# Patient Record
Sex: Female | Born: 1955 | Race: White | Hispanic: No | Smoking: Never smoker
Health system: Southern US, Community
[De-identification: ages and names within clinical notes are randomized; demographics above are authoritative.]

## PROBLEM LIST (undated history)

## (undated) DIAGNOSIS — D649 Anemia, unspecified: Secondary | ICD-10-CM

## (undated) DIAGNOSIS — R87619 Unspecified abnormal cytological findings in specimens from cervix uteri: Secondary | ICD-10-CM

## (undated) DIAGNOSIS — T7840XA Allergy, unspecified, initial encounter: Secondary | ICD-10-CM

## (undated) DIAGNOSIS — D249 Benign neoplasm of unspecified breast: Secondary | ICD-10-CM

## (undated) HISTORY — DX: Anemia, unspecified: D64.9

## (undated) HISTORY — DX: Benign neoplasm of unspecified breast: D24.9

## (undated) HISTORY — DX: Unspecified abnormal cytological findings in specimens from cervix uteri: R87.619

## (undated) HISTORY — DX: Allergy, unspecified, initial encounter: T78.40XA

## (undated) HISTORY — PX: VARICOSE VEIN SURGERY: SHX832

---

## 1997-12-12 ENCOUNTER — Other Ambulatory Visit: Admission: RE | Admit: 1997-12-12 | Discharge: 1997-12-12 | Payer: Self-pay | Admitting: Obstetrics and Gynecology

## 1997-12-20 ENCOUNTER — Ambulatory Visit (HOSPITAL_COMMUNITY): Admission: RE | Admit: 1997-12-20 | Discharge: 1997-12-20 | Payer: Self-pay | Admitting: Obstetrics and Gynecology

## 1998-01-19 ENCOUNTER — Ambulatory Visit (HOSPITAL_COMMUNITY): Admission: RE | Admit: 1998-01-19 | Discharge: 1998-01-19 | Payer: Self-pay | Admitting: Obstetrics and Gynecology

## 1999-04-05 ENCOUNTER — Encounter: Payer: Self-pay | Admitting: Obstetrics and Gynecology

## 1999-04-05 ENCOUNTER — Ambulatory Visit (HOSPITAL_COMMUNITY): Admission: RE | Admit: 1999-04-05 | Discharge: 1999-04-05 | Payer: Self-pay | Admitting: Obstetrics and Gynecology

## 1999-05-08 ENCOUNTER — Other Ambulatory Visit: Admission: RE | Admit: 1999-05-08 | Discharge: 1999-05-08 | Payer: Self-pay | Admitting: Obstetrics and Gynecology

## 2000-11-12 ENCOUNTER — Encounter: Payer: Self-pay | Admitting: Emergency Medicine

## 2000-11-12 ENCOUNTER — Emergency Department (HOSPITAL_COMMUNITY): Admission: EM | Admit: 2000-11-12 | Discharge: 2000-11-12 | Payer: Self-pay | Admitting: Emergency Medicine

## 2002-12-23 ENCOUNTER — Encounter: Payer: Self-pay | Admitting: Obstetrics and Gynecology

## 2002-12-23 ENCOUNTER — Ambulatory Visit (HOSPITAL_COMMUNITY): Admission: RE | Admit: 2002-12-23 | Discharge: 2002-12-23 | Payer: Self-pay | Admitting: Obstetrics and Gynecology

## 2003-03-15 ENCOUNTER — Encounter: Payer: Self-pay | Admitting: Obstetrics and Gynecology

## 2003-03-15 ENCOUNTER — Encounter: Admission: RE | Admit: 2003-03-15 | Discharge: 2003-03-15 | Payer: Self-pay | Admitting: Obstetrics and Gynecology

## 2003-04-05 ENCOUNTER — Other Ambulatory Visit: Admission: RE | Admit: 2003-04-05 | Discharge: 2003-04-05 | Payer: Self-pay | Admitting: Obstetrics and Gynecology

## 2004-10-29 ENCOUNTER — Encounter: Admission: RE | Admit: 2004-10-29 | Discharge: 2005-01-17 | Payer: Self-pay | Admitting: Family Medicine

## 2005-02-10 ENCOUNTER — Encounter: Admission: RE | Admit: 2005-02-10 | Discharge: 2005-02-10 | Payer: Self-pay | Admitting: Obstetrics and Gynecology

## 2005-04-09 ENCOUNTER — Other Ambulatory Visit: Admission: RE | Admit: 2005-04-09 | Discharge: 2005-04-09 | Payer: Self-pay | Admitting: Obstetrics and Gynecology

## 2005-05-19 ENCOUNTER — Encounter: Admission: RE | Admit: 2005-05-19 | Discharge: 2005-06-27 | Payer: Self-pay | Admitting: Neurosurgery

## 2005-09-03 ENCOUNTER — Other Ambulatory Visit: Admission: RE | Admit: 2005-09-03 | Discharge: 2005-09-03 | Payer: Self-pay | Admitting: Obstetrics and Gynecology

## 2006-01-20 ENCOUNTER — Other Ambulatory Visit: Admission: RE | Admit: 2006-01-20 | Discharge: 2006-01-20 | Payer: Self-pay | Admitting: Obstetrics and Gynecology

## 2006-11-12 ENCOUNTER — Encounter: Admission: RE | Admit: 2006-11-12 | Discharge: 2006-11-12 | Payer: Self-pay | Admitting: Obstetrics and Gynecology

## 2009-11-09 ENCOUNTER — Encounter: Admission: RE | Admit: 2009-11-09 | Discharge: 2009-11-09 | Payer: Self-pay | Admitting: Obstetrics and Gynecology

## 2010-11-28 ENCOUNTER — Other Ambulatory Visit: Payer: Self-pay | Admitting: Obstetrics and Gynecology

## 2010-11-28 DIAGNOSIS — Z1231 Encounter for screening mammogram for malignant neoplasm of breast: Secondary | ICD-10-CM

## 2010-12-06 ENCOUNTER — Ambulatory Visit
Admission: RE | Admit: 2010-12-06 | Discharge: 2010-12-06 | Disposition: A | Discharge status: Home / Self Care | Payer: 59 | Source: Ambulatory Visit | Attending: Obstetrics and Gynecology | Admitting: Obstetrics and Gynecology

## 2010-12-06 DIAGNOSIS — Z1231 Encounter for screening mammogram for malignant neoplasm of breast: Secondary | ICD-10-CM

## 2011-08-18 ENCOUNTER — Ambulatory Visit: Payer: 59 | Admitting: Physician Assistant

## 2011-08-18 VITALS — BP 110/76 | HR 76 | Temp 98.5°F | Resp 20 | Ht 67.0 in | Wt 122.2 lb

## 2011-08-18 DIAGNOSIS — J019 Acute sinusitis, unspecified: Secondary | ICD-10-CM

## 2011-08-18 MED ORDER — CEFDINIR 300 MG PO CAPS: 600.0000 mg | ORAL_CAPSULE | Freq: Every day | ORAL | Status: AC

## 2011-08-18 MED ORDER — GUAIFENESIN ER 1200 MG PO TB12: 1.0000 | ORAL_TABLET | Freq: Two times a day (BID) | ORAL | Status: DC | PRN

## 2011-08-18 MED ORDER — HYDROCOD POLST-CHLORPHEN POLST 10-8 MG/5ML PO LQCR: 5.0000 mL | Freq: Two times a day (BID) | ORAL | Status: DC | PRN

## 2011-08-18 MED ORDER — IPRATROPIUM BROMIDE 0.03 % NA SOLN: 2.0000 | Freq: Two times a day (BID) | NASAL | Status: DC

## 2011-08-18 NOTE — Patient Instructions (Signed)
Rest.  Drink at least 64 ounces of fluids daily.  Return if your symptoms are not improving in 48 hours, sooner if you worsen.

## 2011-08-18 NOTE — Progress Notes (Signed)
  Subjective:    Patient ID: Shannon Lambert, female    DOB: 1956-01-31, 56 y.o.   MRN: [id number]  HPI Patient presents with 9 days of sinus pressure and pain, thick mucous drainage and cough. She has a sore throat and ear fullness. Cough and sinus pain or keeping her awake. Mild nausea, associated with thick postnasal drainage. No diarrhea. Some myalgias in the neck and back but not elsewhere. No urinary symptoms. No rash. Mucinex D seems to make her symptoms worse.   Review of Systems     Objective:   Physical Exam  Vitals reviewed. Constitutional: She is oriented to person, place, and time. Vital signs are normal. She appears well-developed and well-nourished. No distress.  HENT:  Head: Normocephalic and atraumatic.  Right Ear: Hearing, tympanic membrane, external ear and ear canal normal.  Left Ear: Hearing, tympanic membrane, external ear and ear canal normal.  Nose: Mucosal edema and rhinorrhea present.  No foreign bodies. Right sinus exhibits maxillary sinus tenderness and frontal sinus tenderness. Left sinus exhibits maxillary sinus tenderness and frontal sinus tenderness.  Mouth/Throat: Uvula is midline, oropharynx is clear and moist and mucous membranes are normal. No uvula swelling. No oropharyngeal exudate.  Eyes: Conjunctivae and EOM are normal. Pupils are equal, round, and reactive to light. Right eye exhibits no discharge. Left eye exhibits no discharge. No scleral icterus.  Neck: Trachea normal, normal range of motion and full passive range of motion without pain. Neck supple. No mass and no thyromegaly present.  Cardiovascular: Normal rate, regular rhythm and normal heart sounds.   Pulmonary/Chest: Effort normal and breath sounds normal.  Lymphadenopathy:       Head (right side): No submandibular, no tonsillar, no preauricular, no posterior auricular and no occipital adenopathy present.       Head (left side): No submandibular, no tonsillar, no preauricular and no occipital  adenopathy present.    She has no cervical adenopathy.       Right: No supraclavicular adenopathy present.       Left: No supraclavicular adenopathy present.  Neurological: She is alert and oriented to person, place, and time. She has normal strength. No cranial nerve deficit or sensory deficit.  Skin: Skin is warm, dry and intact. No rash noted.  Psychiatric: She has a normal mood and affect. Her speech is normal and behavior is normal.      Assessment & Plan:  Sinusitis. Discontinue Mucinex D. Replace with Mucinex at maximum strength. Omnicef 300 mg 2 capsules daily x10 days. Atrovent nasal spray, 2 sprays in each nostril twice daily, Tussionex 5 mL by mouth every 12 hours when necessary. She has side effects of bad dreams and hallucinations with codeine, has never tried hydrocodone but is interested in trying this. We discussed the potential of similar side effects but that she also may tolerate it without any adverse effects. If she experiences adverse effects she will call, we could replace the Tussionex with Tessalon Perles.  Rest, fluids, ibuprofen or acetaminophen. Out of work tomorrow. Return for reevaluation if symptoms worsen or persist without improvement beyond the next 48 hours.

## 2012-01-11 ENCOUNTER — Ambulatory Visit: Payer: 59 | Admitting: Family Medicine

## 2012-01-11 VITALS — BP 118/65 | HR 52 | Temp 98.3°F | Resp 17 | Ht 67.5 in | Wt 126.0 lb

## 2012-01-11 DIAGNOSIS — M542 Cervicalgia: Secondary | ICD-10-CM

## 2012-01-11 DIAGNOSIS — M25519 Pain in unspecified shoulder: Secondary | ICD-10-CM

## 2012-01-11 DIAGNOSIS — M25511 Pain in right shoulder: Secondary | ICD-10-CM

## 2012-01-11 MED ORDER — CYCLOBENZAPRINE HCL 5 MG PO TABS: 5.0000 mg | ORAL_TABLET | Freq: Two times a day (BID) | ORAL | Status: AC | PRN

## 2012-01-11 MED ORDER — PREDNISONE 20 MG PO TABS: ORAL_TABLET | ORAL | Status: DC

## 2012-01-11 NOTE — Progress Notes (Signed)
56 yo woman with right medial scapular and shoulder pain after looking to the right for several hours one week ago. She feels some numbness ulnar three fingers.  Also has chronic right thenar pain.  Ibuprofen not helping  Objective:  NAD  Tender medial superior corner of right scapula Full shoulder ROM nontender shoulder  Skin: no ecchymosis or rash  Assessment:  C7 neuropathy  Plan:   1. Shoulder pain, right  predniSONE (DELTASONE) 20 MG tablet, cyclobenzaprine (FLEXERIL) 5 MG tablet  2. Neck pain on right side  predniSONE (DELTASONE) 20 MG tablet, cyclobenzaprine (FLEXERIL) 5 MG tablet

## 2012-01-23 ENCOUNTER — Other Ambulatory Visit: Payer: Self-pay | Admitting: Obstetrics and Gynecology

## 2012-01-23 DIAGNOSIS — Z1231 Encounter for screening mammogram for malignant neoplasm of breast: Secondary | ICD-10-CM

## 2012-02-02 ENCOUNTER — Ambulatory Visit: Payer: 59

## 2012-02-05 ENCOUNTER — Ambulatory Visit: Payer: 59

## 2012-04-19 ENCOUNTER — Ambulatory Visit
Admission: RE | Admit: 2012-04-19 | Discharge: 2012-04-19 | Disposition: A | Discharge status: Home / Self Care | Payer: 59 | Source: Ambulatory Visit | Attending: Obstetrics and Gynecology | Admitting: Obstetrics and Gynecology

## 2012-04-19 DIAGNOSIS — Z1231 Encounter for screening mammogram for malignant neoplasm of breast: Secondary | ICD-10-CM

## 2013-04-05 ENCOUNTER — Ambulatory Visit: Payer: 59 | Admitting: Family Medicine

## 2013-04-05 VITALS — BP 120/78 | HR 72 | Temp 99.5°F | Resp 18 | Ht 67.5 in | Wt 125.0 lb

## 2013-04-05 DIAGNOSIS — R51 Headache: Secondary | ICD-10-CM

## 2013-04-05 DIAGNOSIS — J209 Acute bronchitis, unspecified: Secondary | ICD-10-CM

## 2013-04-05 DIAGNOSIS — R519 Headache, unspecified: Secondary | ICD-10-CM

## 2013-04-05 MED ORDER — AZITHROMYCIN 250 MG PO TABS: ORAL_TABLET | ORAL | Status: DC

## 2013-04-05 MED ORDER — HYDROCODONE-HOMATROPINE 5-1.5 MG/5ML PO SYRP: 5.0000 mL | ORAL_SOLUTION | Freq: Three times a day (TID) | ORAL | Status: DC | PRN

## 2013-04-05 NOTE — Progress Notes (Signed)
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  No cough or shortness of breath. Denies any sinus congestion. Burning with cough.  Objective:  NAD Cough

## 2013-06-15 ENCOUNTER — Ambulatory Visit: Payer: 59 | Admitting: Family Medicine

## 2013-06-15 VITALS — BP 118/74 | HR 51 | Temp 98.3°F | Resp 16 | Ht 67.5 in | Wt 127.8 lb

## 2013-06-15 DIAGNOSIS — M25511 Pain in right shoulder: Secondary | ICD-10-CM

## 2013-06-15 DIAGNOSIS — J209 Acute bronchitis, unspecified: Secondary | ICD-10-CM

## 2013-06-15 DIAGNOSIS — M25519 Pain in unspecified shoulder: Secondary | ICD-10-CM

## 2013-06-15 DIAGNOSIS — M542 Cervicalgia: Secondary | ICD-10-CM

## 2013-06-15 DIAGNOSIS — R51 Headache: Secondary | ICD-10-CM

## 2013-06-15 DIAGNOSIS — R519 Headache, unspecified: Secondary | ICD-10-CM

## 2013-06-15 MED ORDER — PREDNISONE 20 MG PO TABS: ORAL_TABLET | ORAL | Status: DC

## 2013-06-15 MED ORDER — AZITHROMYCIN 250 MG PO TABS: ORAL_TABLET | ORAL | Status: DC

## 2013-06-15 NOTE — Progress Notes (Signed)
° °  Subjective:  This chart was scribed for Elvina Sidle, MD by Carl Best, Medical Scribe. This patient was seen in Room 5 and the patient's care was started at 6:42 PM.  Patient ID: Shannon Lambert, female    DOB: [birth year], 57 y.o.   MRN: [id number]  HPI HPI Comments: Shannon Lambert is a 57 y.o. female who presents to the [facility] complaining of otalgia in her left ear which causes a roaring sound, right sided neck stiffness and pain, and facial pain that started a week ago.  She states that her symptoms will start with her neck pain and then progress to the otalgia which causes her to hear a "roaring" sound in her left ear.  She states that loud noises aggravate her otalgia.  She states that she has taken Execedrine, Sudafed, and Ibuprofen for her symptoms with no relief.  She states that when she was prescribed a Z-Pak last time she came to [facility] and experienced no problems when taking it.  She states that Penicillin causes abdominal pain. 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     Past Medical History  Diagnosis Date   Allergy    Past Surgical History  Procedure Laterality Date   Varicose vein surgery Bilateral     laser surgery June 2013 and 05/20/12,    No family history on file. History   Social History   Marital Status: Divorced    Spouse Name: N/A    Number of Children: N/A   Years of Education: N/A   Occupational History   Not on file.   Social History Main Topics   Smoking status: Never Smoker    Smokeless tobacco: Not on file   Alcohol Use: No   Drug Use: No   Sexual Activity: Not on file   Other Topics Concern   Not on file   Social History Narrative   No narrative on file   Allergies  Allergen Reactions   Codeine     Bad dreams/hallucinations   Penicillins     GI upset     Review of Systems  HENT: Positive for ear pain (left ear).   Musculoskeletal: Positive  for neck pain and neck stiffness.  All other systems reviewed and are negative.      Objective:  Physical Exam No acute distress Neck: Patient is able to turn it both ways and put her chin on her chest without to much difficulty. She points to the posterior right neck as a source of pain. Eardrums: Mild retraction on the left otherwise both are normal Oropharynx: Clear Neck exam reveals no specific adenopathy Skin: No rash or localized lesion   Assessment & Plan:   I personally performed the services described in this documentation, which was scribed in my presence. The recorded information has been reviewed and is accurate.  Acute bronchitis - Plan: azithromycin (ZITHROMAX Z-PAK) 250 MG tablet  Headache - Plan: azithromycin (ZITHROMAX Z-PAK) 250 MG tablet  Neck pain on right side - Plan: predniSONE (DELTASONE) 20 MG tablet  Shoulder pain, right - Plan: predniSONE (DELTASONE) 20 MG tablet  Signed, Elvina Sidle, MD

## 2013-06-16 ENCOUNTER — Telehealth: Payer: Self-pay

## 2013-06-16 DIAGNOSIS — R110 Nausea: Secondary | ICD-10-CM

## 2013-06-16 MED ORDER — ONDANSETRON 4 MG PO TBDP: 4.0000 mg | ORAL_TABLET | Freq: Three times a day (TID) | ORAL | Status: DC | PRN

## 2013-06-16 NOTE — Telephone Encounter (Signed)
Spoke to her to advise the Zpack is for bronchitis, the prednisone is for her neck. The Zpack is making her nauseated, please advise. She is taking with food and getting lots of fluids, this has not been helpful

## 2013-06-16 NOTE — Telephone Encounter (Signed)
Thanks. Left message for daughter to advise.

## 2013-06-16 NOTE — Telephone Encounter (Signed)
Patient's daughter states that patient is not responding well to the Z-Pak for her neck pain. She has nausea and a headache.[facility]  [phone]

## 2013-06-16 NOTE — Telephone Encounter (Signed)
Nausea is a common side effect of all antibiotics. I can send in some Zofran.

## 2013-06-21 ENCOUNTER — Other Ambulatory Visit: Payer: Self-pay | Admitting: Family Medicine

## 2013-06-22 ENCOUNTER — Telehealth: Payer: Self-pay

## 2013-06-22 NOTE — Telephone Encounter (Signed)
Spoke to her advised Mucinex/ flonase for ear. Advised Zpack still working even though she has completed. She will give these a day or so and return if not improved or if she worsens.

## 2013-06-22 NOTE — Telephone Encounter (Signed)
I do not see message from yesterday there was a medication denial sent. Patient should return to clinic. If she would like to try mucinex and flonase, this may be helpful. Left message for her to call me back so I can advise.

## 2013-06-22 NOTE — Telephone Encounter (Signed)
Patient states that she is having pressure in her ears and head. States she left a message yesterday and has not heard anything.  [phone]

## 2013-06-23 ENCOUNTER — Telehealth: Payer: Self-pay | Admitting: Radiology

## 2013-06-23 MED ORDER — IPRATROPIUM BROMIDE 0.03 % NA SOLN: 2.0000 | Freq: Two times a day (BID) | NASAL | Status: DC

## 2013-06-23 MED ORDER — GUAIFENESIN ER 1200 MG PO TB12: 1.0000 | ORAL_TABLET | Freq: Two times a day (BID) | ORAL | Status: DC | PRN

## 2013-06-23 NOTE — Telephone Encounter (Signed)
Please call patient. She is upset she needs to speak to someone about her recent visit. She has concerns about the visit, indicates she waited 2 and 1 /2 hours and was seen very quickly, and she does not feel as though she had a good exam, states her lungs/heart were not listened to and she is very upset she had to come in again and pay for another visit. Call her please, [phone]

## 2013-06-23 NOTE — Telephone Encounter (Signed)
Rx for atrovent sent to the pharmacy. Take this in addition to plain zyrtec. If dizziness continues she will need to be rechecked. Patient informed by French Ana

## 2013-06-23 NOTE — Telephone Encounter (Signed)
Spoke to patient she is complaining of increased dizziness and ear pain. She wants another antibiotic. Advised her to return to clinic.

## 2013-06-23 NOTE — Telephone Encounter (Signed)
Symptoms resolved after using the z-pak and prednisone. She has been using Mucinex as recommended from Amy. However, in the last week symptoms have returned. Dizziness from pressure in hears, headache. Can we send in RF on z-pak? I advised her that if this does not work, she would have to RTC to be seen. Please advise.  [facility]

## 2013-07-05 ENCOUNTER — Telehealth: Payer: Self-pay

## 2013-07-05 DIAGNOSIS — H9209 Otalgia, unspecified ear: Secondary | ICD-10-CM

## 2013-07-05 NOTE — Telephone Encounter (Signed)
Okay for referral? Please advise   

## 2013-07-05 NOTE — Telephone Encounter (Signed)
PT STATES HER EARS ISN'T GETTING ANY BETTER AND WOULD LIKE TO BE REFERRED TO AN ENT PLEASE CALL [phone] AND LEAVE MESSAGE

## 2013-10-18 ENCOUNTER — Other Ambulatory Visit: Payer: Self-pay

## 2013-10-18 DIAGNOSIS — Z1231 Encounter for screening mammogram for malignant neoplasm of breast: Secondary | ICD-10-CM

## 2013-10-27 ENCOUNTER — Encounter (INDEPENDENT_AMBULATORY_CARE_PROVIDER_SITE_OTHER): Payer: Self-pay

## 2013-10-27 ENCOUNTER — Ambulatory Visit
Admission: RE | Admit: 2013-10-27 | Discharge: 2013-10-27 | Disposition: A | Discharge status: Home / Self Care | Payer: Self-pay | Source: Ambulatory Visit

## 2013-10-27 DIAGNOSIS — Z1231 Encounter for screening mammogram for malignant neoplasm of breast: Secondary | ICD-10-CM

## 2014-04-13 ENCOUNTER — Ambulatory Visit: Payer: 59 | Admitting: Certified Nurse Midwife

## 2014-05-10 ENCOUNTER — Encounter: Payer: Self-pay | Admitting: Certified Nurse Midwife

## 2014-05-11 ENCOUNTER — Telehealth: Payer: Self-pay | Admitting: Certified Nurse Midwife

## 2014-05-11 ENCOUNTER — Ambulatory Visit: Payer: 59 | Admitting: Certified Nurse Midwife

## 2014-05-11 NOTE — Telephone Encounter (Signed)
Pt rescheduled appointment today because she is sick. Will not charge a dnka fee.

## 2014-05-18 ENCOUNTER — Ambulatory Visit (INDEPENDENT_AMBULATORY_CARE_PROVIDER_SITE_OTHER): Payer: 59 | Admitting: Certified Nurse Midwife

## 2014-05-18 ENCOUNTER — Encounter: Payer: Self-pay | Admitting: Certified Nurse Midwife

## 2014-05-18 VITALS — BP 110/68 | HR 68 | Resp 16 | Ht 67.25 in | Wt 126.0 lb

## 2014-05-18 DIAGNOSIS — Z1211 Encounter for screening for malignant neoplasm of colon: Secondary | ICD-10-CM

## 2014-05-18 DIAGNOSIS — Z23 Encounter for immunization: Secondary | ICD-10-CM

## 2014-05-18 DIAGNOSIS — Z124 Encounter for screening for malignant neoplasm of cervix: Secondary | ICD-10-CM

## 2014-05-18 DIAGNOSIS — Z01419 Encounter for gynecological examination (general) (routine) without abnormal findings: Secondary | ICD-10-CM

## 2014-05-18 NOTE — Progress Notes (Signed)
58 y.o. [id number] Divorced Caucasian Fe here for annual exam. Menopausal no HRT. Denies vaginal bleeding, some vaginal dryness. Sees Urgent Care if needed. Aware she needs colonoscopy, but can not afford if insurance does not cover it. Denies any stool changes but family history of colon cancer(father). No health issues today, desires screening labs.  Patient's last menstrual period was 02/04/2009.          Sexually active: No.  The current method of family planning is abstinence.    Exercising: Yes.    running Smoker:  no  Health Maintenance: Pap:  04-03-11 neg MMG: 10-27-2013 density category d, birads category 1:neg Colonoscopy:  none BMD:   none TDaP:  Unsure per patient Labs: none Self breast exam:done monthly   reports that she has never smoked. She does not have any smokeless tobacco history on file. She reports that she does not drink alcohol or use illicit drugs.  Past Medical History  Diagnosis Date  . Allergy   . Anemia     with pregnancy  . 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 Abnormal Pap smear of cervix     unsure of treatment. yrs ago    Past Surgical History  Procedure Laterality Date  . Varicose vein surgery Bilateral     laser surgery June 2013 and 05/20/12,     Current Outpatient Prescriptions  Medication Sig Dispense Refill  . Ascorbic Acid (VITAMIN C) 1000 MG tablet Take 1,000 mg by mouth daily.    Marland Kitchen b complex vitamins tablet Take 1 tablet by mouth daily.    Marland Kitchen BIOTIN PO Take by mouth.    Marland Kitchen CALCIUM PO Take by mouth.    . ECHINACEA PO Take by mouth.    . Flaxseed, Linseed, (FLAXSEED OIL PO) Take by mouth.    . Ginkgo Biloba (GINKOBA PO) Take by mouth.    . multivitamin (THERAGRAN) per tablet Take 1 tablet by mouth daily.    . Omega-3 Fatty Acids (FISH OIL) 1000 MG CAPS Take by mouth daily.    . Probiotic Product (PROBIOTIC PO) Take by mouth.    . RESVERATROL PO Take by mouth.    Marland Kitchen UNABLE TO FIND as needed. emergen-c     No current  facility-administered medications for this visit.    Family History  Problem Relation Age of Onset  . Colon cancer Father     ROS:  Pertinent items are noted in HPI.  Otherwise, a comprehensive ROS was negative.  Exam:   BP 110/68 mmHg  Pulse 68  Resp 16  Ht 5' 7.25" (1.708 m)  Wt 126 lb (57.153 kg)  BMI 19.59 kg/m2  LMP 02/04/2009 Height: 5' 7.25" (170.8 cm)  Ht Readings from Last 3 Encounters:  05/18/14 5' 7.25" (1.708 m)  06/15/13 5' 7.5" (1.715 m)  04/05/13 5' 7.5" (1.715 m)    General appearance: alert, cooperative and appears stated age Head: Normocephalic, without obvious abnormality, atraumatic Neck: no adenopathy, supple, symmetrical, trachea midline and thyroid normal to inspection and palpation Lungs: clear to auscultation bilaterally Breasts: normal appearance, no masses or tenderness, No nipple retraction or dimpling, No nipple discharge or bleeding, No axillary or supraclavicular adenopathy Heart: regular rate and rhythm Abdomen: soft, non-tender; no masses,  no organomegaly Extremities: extremities normal, atraumatic, no cyanosis or edema Skin: Skin color, texture, turgor normal. No rashes or lesions Lymph nodes: Cervical, supraclavicular, and axillary nodes normal. No abnormal inguinal nodes palpated Neurologic: Grossly normal   Pelvic: External genitalia:  no lesions              Urethra:  normal appearing urethra with no masses, tenderness or lesions              Bartholin's and Skene's: normal                 Vagina: normal appearing vagina with normal color and discharge, no lesions              Cervix: normal , non tender, no lesions              Pap taken: Yes.   Bimanual Exam:  Uterus:  normal size, contour, position, consistency, mobility, non-tender and mid position              Adnexa: normal adnexa and no mass, fullness, tenderness               Rectovaginal: Confirms               Anus:  normal sphincter tone, no lesions  A:  Well Woman  with normal exam  Menopausal no HRT  Colonoscopy due( family history of colon cancer, father  Immunization update  P:   Reviewed health and wellness pertinent to exam  Discussed need to advise if vaginal bleeding   IFOB dispensed and patient to check with insurance about screening and will call when ready to schedule  Requests TDAP  Pap smear taken today with HPVHR   counseled on breast self exam, mammography screening, adequate intake of calcium and vitamin D, diet and exercise  return annually or prn  An After Visit Summary was printed and given to the patient.

## 2014-05-18 NOTE — Patient Instructions (Signed)

## 2014-05-19 NOTE — Progress Notes (Signed)
Reviewed personally.  M. Suzanne Canary Fister, MD.  

## 2014-05-22 LAB — IPS PAP TEST WITH HPV

## 2014-07-26 LAB — FECAL OCCULT BLOOD, IMMUNOCHEMICAL: IFOBT: NEGATIVE

## 2014-07-26 NOTE — Addendum Note (Signed)
Addended by: Elroy Channel on: 07/26/2014 01:51 PM   Modules accepted: Orders, SmartSet

## 2014-09-30 IMAGING — MG MM SCREEN MAMMOGRAM BILATERAL
2 series · 2 of 2 positions shown · non-contrast
Comparison: Previous exam(s).

CLINICAL DATA: Screening. Benign cyst aspiration of the left breast
in 5638.

EXAM:
DIGITAL SCREENING BILATERAL MAMMOGRAM WITH CAD

[R CC]
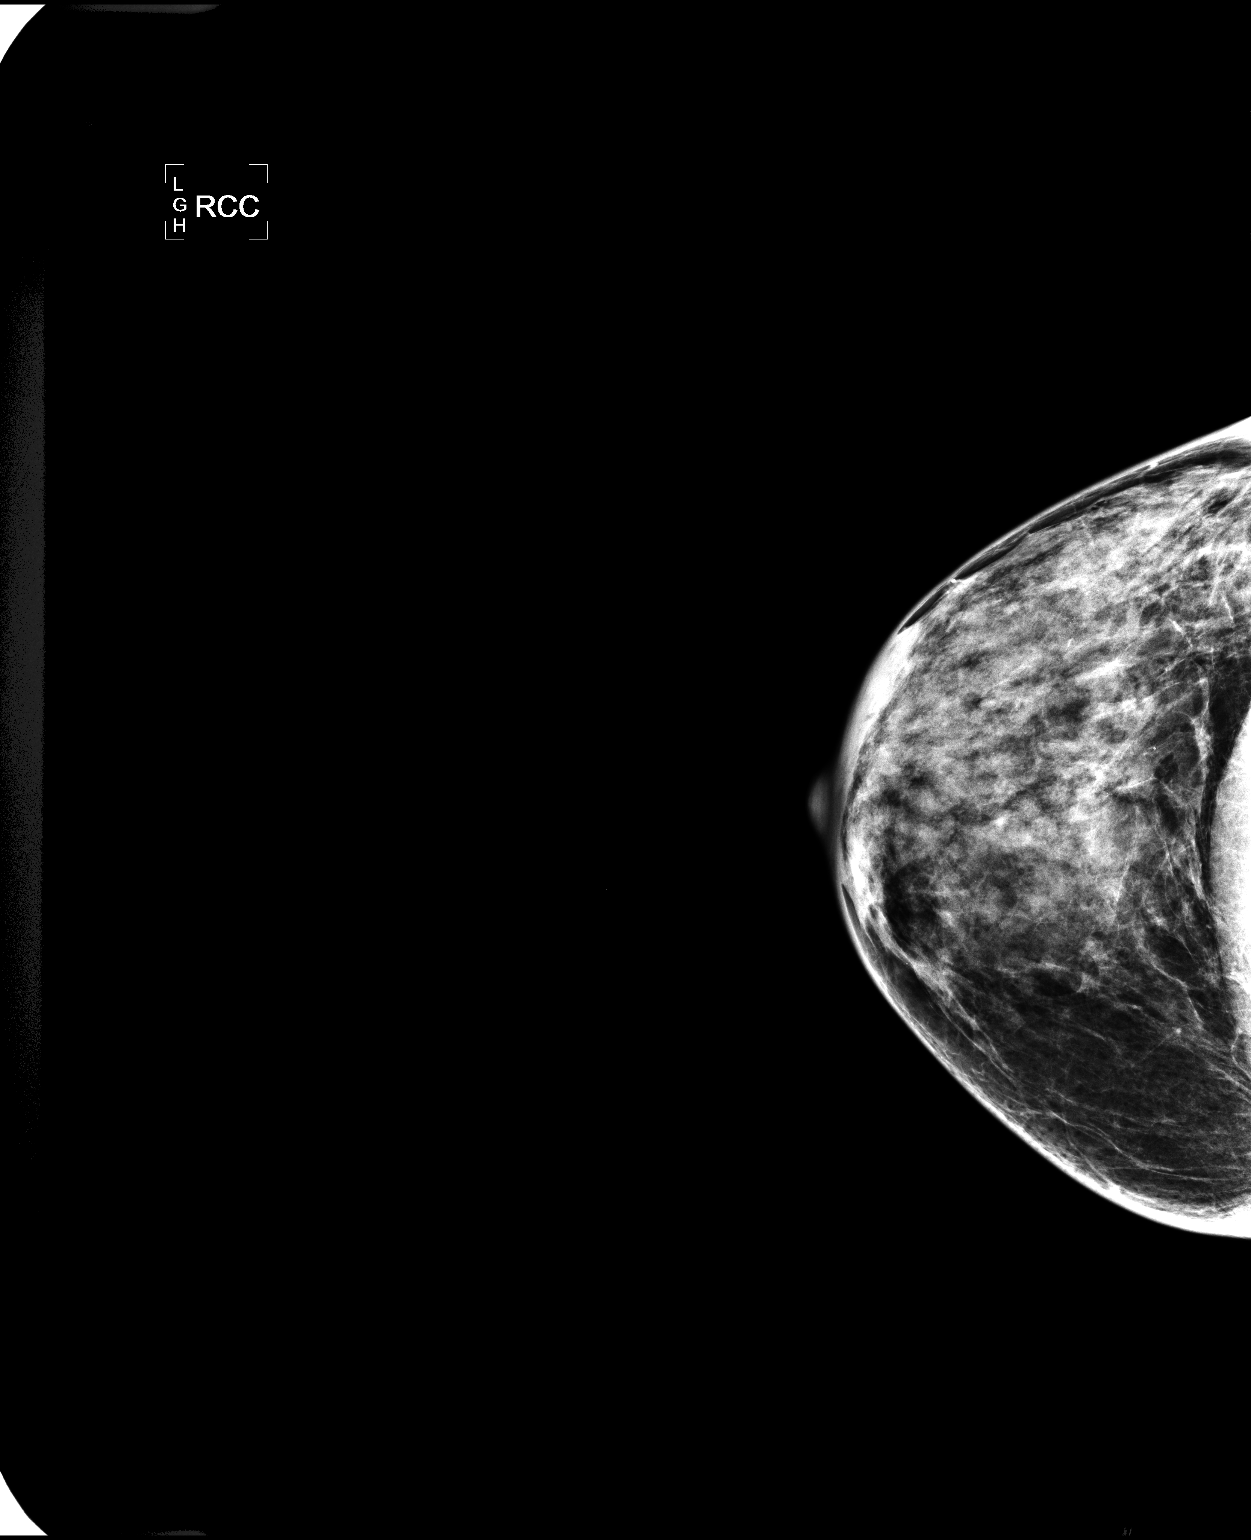

[R MLO]
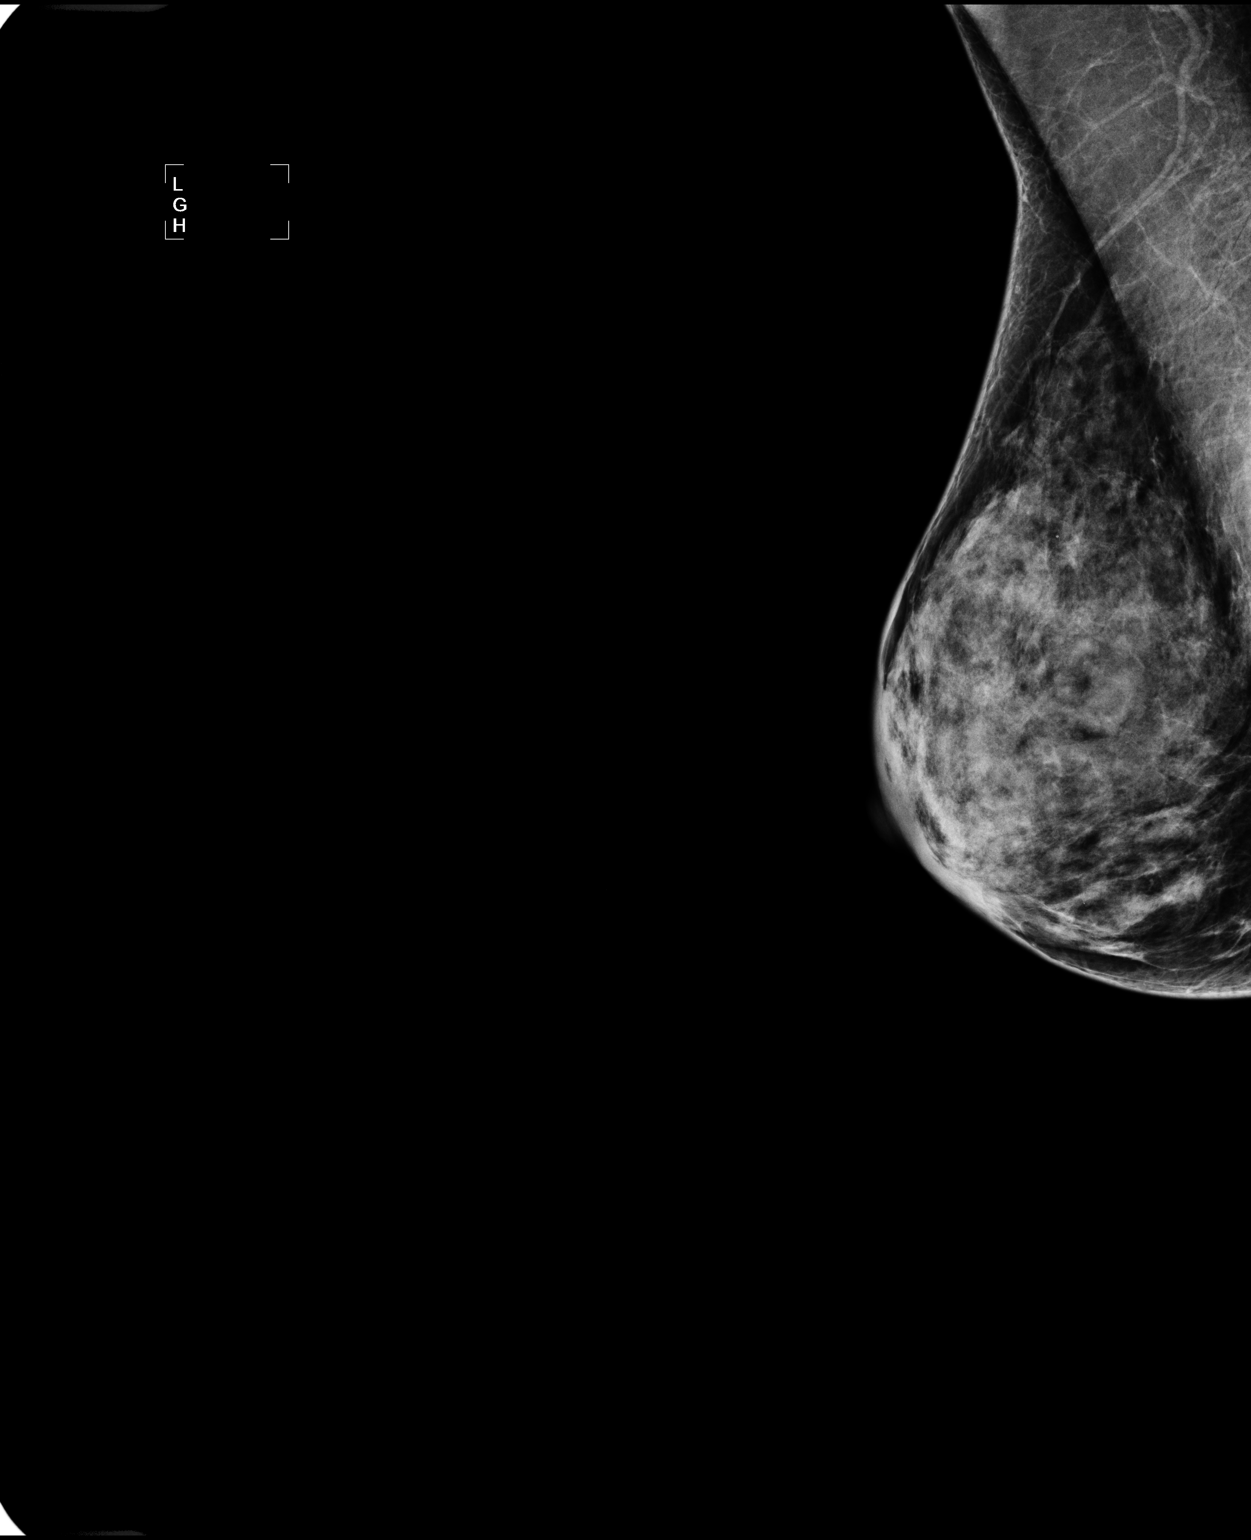

[2 of 2 positions shown; findings below may reference images not displayed]

ACR Breast Density Category d: The breast tissue is extremely dense,
which lowers the sensitivity of mammography.
FINDINGS: There are no findings suspicious for malignancy. Images were
processed with CAD.
IMPRESSION: No mammographic evidence of malignancy. A result letter of this
screening mammogram will be mailed directly to the patient.

RECOMMENDATION:
Screening mammogram in one year. (Code:5M-L-AUI)

BI-RADS CATEGORY  1: Negative.

## 2015-06-05 ENCOUNTER — Ambulatory Visit: Payer: 59 | Admitting: Certified Nurse Midwife
